# Patient Record
Sex: Female | Born: 1995 | Race: Black or African American | Hispanic: No | Marital: Single | Smoking: Never smoker
Health system: Southern US, Community
[De-identification: ages and names within clinical notes are randomized; demographics above are authoritative.]

## PROBLEM LIST (undated history)

## (undated) ENCOUNTER — Emergency Department (HOSPITAL_COMMUNITY): Admission: EM | Payer: Self-pay | Source: Home / Self Care

## (undated) DIAGNOSIS — K589 Irritable bowel syndrome without diarrhea: Secondary | ICD-10-CM

## (undated) DIAGNOSIS — D649 Anemia, unspecified: Secondary | ICD-10-CM

## (undated) DIAGNOSIS — J45909 Unspecified asthma, uncomplicated: Secondary | ICD-10-CM

## (undated) HISTORY — PX: HERNIA REPAIR: SHX51

---
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
## 2017-02-11 ENCOUNTER — Encounter (HOSPITAL_COMMUNITY): Payer: Self-pay | Admitting: Emergency Medicine

## 2017-02-11 ENCOUNTER — Emergency Department (HOSPITAL_COMMUNITY): Payer: 59

## 2017-02-11 DIAGNOSIS — R05 Cough: Secondary | ICD-10-CM | POA: Diagnosis present

## 2017-02-11 DIAGNOSIS — J4541 Moderate persistent asthma with (acute) exacerbation: Secondary | ICD-10-CM | POA: Diagnosis not present

## 2017-02-11 DIAGNOSIS — J019 Acute sinusitis, unspecified: Secondary | ICD-10-CM | POA: Diagnosis not present

## 2017-02-11 MED ORDER — ALBUTEROL SULFATE (2.5 MG/3ML) 0.083% IN NEBU
INHALATION_SOLUTION | RESPIRATORY_TRACT | Status: AC
  Filled 2017-02-11: qty 6

## 2017-02-11 MED ORDER — ALBUTEROL SULFATE (2.5 MG/3ML) 0.083% IN NEBU
5.0000 mg | INHALATION_SOLUTION | Freq: Once | RESPIRATORY_TRACT | Status: AC
  Administered 2017-02-11: 5 mg via RESPIRATORY_TRACT

## 2017-02-11 NOTE — ED Triage Notes (Signed)
Pt presents with non-productive cough and SOB that began after she was at camp and made to run in the rain; pt states her asthma is activity and weather induced; pt reports taking her inhaler without relief; now has a sore throat from coughing

## 2017-02-12 ENCOUNTER — Emergency Department (HOSPITAL_COMMUNITY)
Admission: EM | Admit: 2017-02-12 | Discharge: 2017-02-12 | Disposition: A | Discharge status: Home / Self Care | Payer: 59 | Attending: Emergency Medicine | Admitting: Emergency Medicine

## 2017-02-12 DIAGNOSIS — J4541 Moderate persistent asthma with (acute) exacerbation: Secondary | ICD-10-CM

## 2017-02-12 DIAGNOSIS — J019 Acute sinusitis, unspecified: Secondary | ICD-10-CM

## 2017-02-12 MED ORDER — ALBUTEROL SULFATE (2.5 MG/3ML) 0.083% IN NEBU
5.0000 mg | INHALATION_SOLUTION | Freq: Once | RESPIRATORY_TRACT | Status: AC
  Administered 2017-02-12: 5 mg via RESPIRATORY_TRACT
  Filled 2017-02-12: qty 6

## 2017-02-12 MED ORDER — IPRATROPIUM BROMIDE 0.02 % IN SOLN
0.5000 mg | Freq: Once | RESPIRATORY_TRACT | Status: AC
  Administered 2017-02-12: 0.5 mg via RESPIRATORY_TRACT
  Filled 2017-02-12: qty 2.5

## 2017-02-12 MED ORDER — PHENOL 1.4 % MT LIQD: 1.0000 | OROMUCOSAL | 0 refills | Status: DC | PRN

## 2017-02-12 MED ORDER — HYDROCODONE-ACETAMINOPHEN 7.5-325 MG/15ML PO SOLN
10.0000 mL | Freq: Once | ORAL | Status: AC
  Administered 2017-02-12: 10 mL via ORAL
  Filled 2017-02-12: qty 15

## 2017-02-12 MED ORDER — BENZONATATE 100 MG PO CAPS: 100.0000 mg | ORAL_CAPSULE | Freq: Three times a day (TID) | ORAL | 0 refills | Status: DC

## 2017-02-12 MED ORDER — AMOXICILLIN-POT CLAVULANATE 875-125 MG PO TABS: 1.0000 | ORAL_TABLET | Freq: Two times a day (BID) | ORAL | 0 refills | Status: DC

## 2017-02-12 MED ORDER — DEXAMETHASONE 4 MG PO TABS
10.0000 mg | ORAL_TABLET | Freq: Once | ORAL | Status: AC
  Administered 2017-02-12: 10 mg via ORAL
  Filled 2017-02-12: qty 3

## 2017-02-12 NOTE — ED Notes (Signed)
Patient given discharge instructions and verbalized understanding.  Patient stable to discharge at this time.  Patient is alert and oriented to baseline.  No distressed noted at this time.  All belongings taken with the patient at discharge.   

## 2017-02-12 NOTE — ED Provider Notes (Signed)
[facility]-EMERGENCY DEPT Provider Note   CSN: [id number] Arrival date & time: 02/11/17  2146     History   Chief Complaint Chief Complaint  Patient presents with  . Asthma  . Cough    HPI Natasha Krause is a 21 y.o. female.  21 year old female with a history of asthma, anemia, and IBS presents to the emergency department for cough and shortness of breath. She states that symptoms began 4 days ago while at band camp when she needed to run in the rain. She states that her asthma is induced by activity and whether. She has tried her albuterol inhaler without relief. She also used a nebulizer prior to arrival with little improvement in her symptoms. Cough is worse with deep breathing and productive of yellow mucus. She will use the mucus is from postnasal drip as she has associated nasal congestion. No associated fevers, nausea, vomiting, syncope. No hemoptysis. Patient states that she used to be on Symbicort, but discontinued this a few months ago as she was having. Her problems with her asthma.      Past Medical History:  Diagnosis Date  . Anemia   . Asthma   . IBS (irritable bowel syndrome)     There are no active problems to display for this patient.   Past Surgical History:  Procedure Laterality Date  . HERNIA REPAIR      OB History    No data available       Home Medications    Prior to Admission medications   Medication Sig Start Date End Date Taking? Authorizing Provider  albuterol (PROVENTIL) (2.5 MG/3ML) 0.083% nebulizer solution Take 2.5 mg by nebulization every 6 (six) hours as needed for wheezing or shortness of breath.    [provider]  amoxicillin-clavulanate (AUGMENTIN) 875-125 MG tablet Take 1 tablet by mouth every 12 (twelve) hours. 02/12/17   Antony Madura, PA-C  benzonatate (TESSALON) 100 MG capsule Take 1 capsule (100 mg total) by mouth every 8 (eight) hours. 02/12/17   Antony Madura, PA-C  budesonide-formoterol (SYMBICORT) 160-4.5 MCG/ACT inhaler  Inhale 2 puffs into the lungs 2 (two) times daily.    [provider]  phenol (CHLORASEPTIC) 1.4 % LIQD Use as directed 1 spray in the mouth or throat as needed for throat irritation / pain. 02/12/17   Antony Madura, PA-C    Family History History reviewed. No pertinent family history.  Social History Social History  Substance Use Topics  . Smoking status: Never Smoker  . Smokeless tobacco: Never Used  . Alcohol use No     Allergies   Fish allergy and Nsaids   Review of Systems Review of Systems Ten systems reviewed and are negative for acute change, except as noted in the HPI.    Physical Exam Updated Vital Signs BP 114/64   Pulse (!) 107   Temp 98.8 F (37.1 C) (Oral)   Resp (!) 22   LMP 12/09/2016   SpO2 97%   Physical Exam  Constitutional: She is oriented to person, place, and time. She appears well-developed and well-nourished. No distress.  Nontoxic appearing and in NAD  HENT:  Head: Normocephalic and atraumatic.  Audible nasal congestion. Patient tolerating secretions without difficulty.  Eyes: Conjunctivae and EOM are normal. No scleral icterus.  Neck: Normal range of motion.  Cardiovascular: Normal rate, regular rhythm and intact distal pulses.   Pulmonary/Chest: Effort normal. No respiratory distress. She has no rales.  Dry cough productive of yellow phlegm. Chest expansion symmetric. Faint expiratory  wheeze; middle lobes. No rales or rhonchi.  Musculoskeletal: Normal range of motion.  Neurological: She is alert and oriented to person, place, and time. She exhibits normal muscle tone. Coordination normal.  GCS 15. Patient moving all extremities; ambulatory with steady gait.  Skin: Skin is warm and dry. No rash noted. She is not diaphoretic. No erythema. No pallor.  Psychiatric: She has a normal mood and affect. Her behavior is normal.  Nursing note and vitals reviewed.    ED Treatments / Results  Labs (all labs ordered are listed, but only  abnormal results are displayed) Labs Reviewed - No data to display  EKG  EKG Interpretation None       Radiology Dg Chest 2 View  Result Date: 02/11/2017 CLINICAL DATA:  Acute onset of shortness of breath and cough. Congestion. Blood in mucus. Initial encounter. EXAM: CHEST  2 VIEW COMPARISON:  None. FINDINGS: The lungs are well-aerated and clear. There is no evidence of focal opacification, pleural effusion or pneumothorax. The heart is normal in size; the mediastinal contour is within normal limits. No acute osseous abnormalities are seen. IMPRESSION: No acute cardiopulmonary process seen. Electronically Signed   By: Roanna RaiderJeffery  Chang M.D.   On: 02/11/2017 23:20    Procedures Procedures (including critical care time)  Medications Ordered in ED Medications  albuterol (PROVENTIL) (2.5 MG/3ML) 0.083% nebulizer solution (  Not Given 02/11/17 2342)  albuterol (PROVENTIL) (2.5 MG/3ML) 0.083% nebulizer solution 5 mg (5 mg Nebulization Given 02/11/17 2209)  dexamethasone (DECADRON) tablet 10 mg (10 mg Oral Given 02/12/17 0215)  albuterol (PROVENTIL) (2.5 MG/3ML) 0.083% nebulizer solution 5 mg (5 mg Nebulization Given 02/12/17 0121)  ipratropium (ATROVENT) nebulizer solution 0.5 mg (0.5 mg Nebulization Given 02/12/17 0120)  HYDROcodone-acetaminophen (HYCET) 7.5-325 mg/15 ml solution 10 mL (10 mLs Oral Given 02/12/17 0214)    2:10 AM Patient reassessed. She reports improvement in her breathing following DuoNeb. Will check pulse ox with ambulation. If no desaturation, anticipate discharge with symptomatic tx and Augmentin.   Initial Impression / Assessment and Plan / ED Course  I have reviewed the triage vital signs and the nursing notes.  Pertinent labs & imaging results that were available during my care of the patient were reviewed by me and considered in my medical decision making (see chart for details).     Patient complaining of symptoms of sinusitis. Symptoms include nasal discharge/PND and  scratchy throat with cough for less than 10 days. Symptoms have been aggravating the patient's asthma. She is afebrile. Xray negative for PNA. Breathing has improved with DuoNeb and steroids. Patient discharged with symptomatic treatment and course of Augmentin. Recommendations for follow-up with primary care physician provided. Patient discharged in stable condition with no unaddressed concerns.   Final Clinical Impressions(s) / ED Diagnoses   Final diagnoses:  Moderate persistent asthma with exacerbation  Acute sinusitis, recurrence not specified, unspecified location    New Prescriptions Discharge Medication List as of 02/12/2017  2:44 AM    START taking these medications   Details  amoxicillin-clavulanate (AUGMENTIN) 875-125 MG tablet Take 1 tablet by mouth every 12 (twelve) hours., Starting Sun 02/12/2017, Print    benzonatate (TESSALON) 100 MG capsule Take 1 capsule (100 mg total) by mouth every 8 (eight) hours., Starting Sun 02/12/2017, Print    phenol (CHLORASEPTIC) 1.4 % LIQD Use as directed 1 spray in the mouth or throat as needed for throat irritation / pain., Starting Sun 02/12/2017, Print         Antony MaduraHumes, Sharmaine Bain,  PA-C 02/12/17 0540    Gilda CreasePollina, Christopher J, MD 02/12/17 [phone]

## 2017-02-12 NOTE — ED Notes (Signed)
While ambulating pt O2 was 100% with a pulse of 120.

## 2017-10-24 ENCOUNTER — Telehealth: Payer: Self-pay | Admitting: Neurology

## 2017-10-24 ENCOUNTER — Ambulatory Visit: Payer: 59 | Admitting: Neurology

## 2017-10-24 NOTE — Telephone Encounter (Signed)
This patient did not show for a new patient appointment today. 

## 2017-12-25 ENCOUNTER — Other Ambulatory Visit: Payer: Self-pay | Admitting: Family

## 2018-03-31 ENCOUNTER — Other Ambulatory Visit: Payer: Self-pay

## 2018-03-31 ENCOUNTER — Emergency Department (HOSPITAL_COMMUNITY)
Admission: EM | Admit: 2018-03-31 | Discharge: 2018-03-31 | Disposition: A | Discharge status: Home / Self Care | Payer: 59 | Attending: Emergency Medicine | Admitting: Emergency Medicine

## 2018-03-31 ENCOUNTER — Emergency Department (HOSPITAL_COMMUNITY): Payer: 59

## 2018-03-31 ENCOUNTER — Encounter (HOSPITAL_COMMUNITY): Payer: Self-pay | Admitting: Emergency Medicine

## 2018-03-31 DIAGNOSIS — J45909 Unspecified asthma, uncomplicated: Secondary | ICD-10-CM | POA: Diagnosis not present

## 2018-03-31 DIAGNOSIS — R0789 Other chest pain: Secondary | ICD-10-CM | POA: Insufficient documentation

## 2018-03-31 DIAGNOSIS — R0602 Shortness of breath: Secondary | ICD-10-CM | POA: Diagnosis not present

## 2018-03-31 DIAGNOSIS — R002 Palpitations: Secondary | ICD-10-CM | POA: Insufficient documentation

## 2018-03-31 LAB — BASIC METABOLIC PANEL
Anion gap: 8 (ref 5–15)
BUN: 13 mg/dL (ref 6–20)
CO2: 26 mmol/L (ref 22–32)
CREATININE: 0.99 mg/dL (ref 0.44–1.00)
Calcium: 9.4 mg/dL (ref 8.9–10.3)
Chloride: 105 mmol/L (ref 98–111)
GFR calc Af Amer: >60 mL/min (ref >60)
Glucose, Bld: 72 mg/dL (ref 70–99)
POTASSIUM: 3.5 mmol/L (ref 3.5–5.1)
SODIUM: 139 mmol/L (ref 135–145)

## 2018-03-31 LAB — CBC
HCT: 42.8 % (ref 36.0–46.0)
HEMOGLOBIN: 14.1 g/dL (ref 12.0–15.0)
MCH: 30.9 pg (ref 26.0–34.0)
MCHC: 32.9 g/dL (ref 30.0–36.0)
MCV: 93.7 fL (ref 78.0–100.0)
Platelets: 251 10*3/uL (ref 150–400)
RBC: 4.57 MIL/uL (ref 3.87–5.11)
RDW: 12.2 % (ref 11.5–15.5)
WBC: 5.4 10*3/uL (ref 4.0–10.5)

## 2018-03-31 LAB — I-STAT TROPONIN, ED: TROPONIN I, POC: 0 ng/mL (ref 0.00–0.08)

## 2018-03-31 LAB — D-DIMER, QUANTITATIVE (NOT AT ARMC)

## 2018-03-31 LAB — I-STAT BETA HCG BLOOD, ED (MC, WL, AP ONLY)

## 2018-03-31 LAB — TSH: TSH: 2.96 u[IU]/mL (ref 0.350–4.500)

## 2018-03-31 NOTE — Discharge Instructions (Addendum)
Your thyroid hormone and your d-dimer were negative.  You do not have a blood clot and it does not appear to have any issues with her thyroid.  New a list of various contraceptive measures.  Please if you are not going to use your NuvaRing follow-up quickly so you avoid any unwanted pregnancies.  He will need to follow-up with a primary care physician in the next 7 to 14 days regarding your continued palpitations.  Contact a health care provider if: You continue to have a fast or irregular heartbeat after 24 hours. Your palpitations occur more often. Get help right away if: You have chest pain or shortness of breath. You have a severe headache. You feel dizzy or you faint.

## 2018-03-31 NOTE — ED Provider Notes (Signed)
[facility] EM[facility]ENC[facility] Provider Note   CSN: [id number] Arrival date & time: 03/31/18  1833     History   Chief Complaint Chief Complaint  Patient presents with  . Palpitations    HPI Natasha Krause is a 22 y.o. female.  He presents the emergency department chief complaint of palpitations.  Patient states that her family has a history of thyroid disorder.  She has had about 3 weeks of intermittent chest pain, racing heart and associated shortness of breath.  Patient states that she is on the NuvaRing and looked up online that it could cause pulmonary embolus and that people were dying from it so she took her NuvaRing out today.  She denies hemoptysis, unilateral leg swelling.  Patient does not smoke cigarettes.  She denies fever, chills.  She denies any recent injuries or confinement.  HPI  Past Medical History:  Diagnosis Date  . Anemia   . Asthma   . IBS (irritable bowel syndrome)     There are no active problems to display for this patient.   Past Surgical History:  Procedure Laterality Date  . HERNIA REPAIR       OB History   None      Home Medications    Prior to Admission medications   Medication Sig Start Date End Date Taking? Authorizing Provider  albuterol (PROVENTIL) (2.5 MG/3ML) 0.083% nebulizer solution Take 2.5 mg by nebulization every 6 (six) hours as needed for wheezing or shortness of breath.    [provider]  amoxicillin-clavulanate (AUGMENTIN) 875-125 MG tablet Take 1 tablet by mouth every 12 (twelve) hours. 02/12/17   Antony MaduraHumes, Kelly, PA-C  benzonatate (TESSALON) 100 MG capsule Take 1 capsule (100 mg total) by mouth every 8 (eight) hours. 02/12/17   Antony MaduraHumes, Kelly, PA-C  budesonide-formoterol (SYMBICORT) 160-4.5 MCG/ACT inhaler Inhale 2 puffs into the lungs 2 (two) times daily.    [provider]  phenol (CHLORASEPTIC) 1.4 % LIQD Use as directed 1 spray in the mouth or throat as needed for throat irritation /  pain. 02/12/17   Antony MaduraHumes, Kelly, PA-C    Family History No family history on file.  Social History Social History   Tobacco Use  . Smoking status: Never Smoker  . Smokeless tobacco: Never Used  Substance Use Topics  . Alcohol use: No  . Drug use: No     Allergies   Fish allergy and Nsaids   Review of Systems Review of Systems  Ten systems reviewed and are negative for acute change, except as noted in the HPI.   Physical Exam Updated Vital Signs BP 109/61   Pulse 66   Temp 99.3 F (37.4 C)   Resp 14   LMP 03/19/2018   SpO2 100%   Physical Exam  Constitutional: She is oriented to person, place, and time. She appears well-developed and well-nourished. No distress.  HENT:  Head: Normocephalic and atraumatic.  Eyes: Conjunctivae are normal. No scleral icterus.  Neck: Normal range of motion.  Cardiovascular: Normal rate, regular rhythm and normal heart sounds. Exam reveals no gallop and no friction rub.  No murmur heard. Pulmonary/Chest: Effort normal and breath sounds normal. No stridor. No respiratory distress. She has no wheezes.  Abdominal: Soft. Bowel sounds are normal. She exhibits no distension and no mass. There is no tenderness. There is no guarding.  Neurological: She is alert and oriented to person, place, and time.  Skin: Skin is warm and dry. She is not diaphoretic.  Psychiatric:  Her behavior is normal.  Nursing note and vitals reviewed.    ED Treatments / Results  Labs (all labs ordered are listed, but only abnormal results are displayed) Labs Reviewed  BASIC METABOLIC PANEL  CBC  D-DIMER, QUANTITATIVE (NOT AT [facility])  TSH  I-STAT TROPONIN, ED  I-STAT BETA HCG BLOOD, ED ([facility], [facility], [facility] ONLY)    EKG None ECG interpretation   Date: 03/31/2018  Rate: 68  Rhythm: normal sinus rhythm  QRS Axis: normal  Intervals: normal  ST/T Wave abnormalities: normal  Conduction Disutrbances: none  Narrative Interpretation:   Old EKG Reviewed:  none  Radiology Dg Chest 2 View  Result Date: 03/31/2018 CLINICAL DATA:  Chest pain and heart palpitations EXAM: CHEST - 2 VIEW COMPARISON:  02/11/2017 FINDINGS: The heart size and mediastinal contours are within normal limits. Both lungs are clear. The visualized skeletal structures are unremarkable. IMPRESSION: No active cardiopulmonary disease. Electronically Signed   By: Tollie Eth M.D.   On: 03/31/2018 19:32    Procedures Procedures (including critical care time)  Medications Ordered in ED Medications - No data to display   Initial Impression / Assessment and Plan / ED Course  I have reviewed the triage vital signs and the nursing notes.  Pertinent labs & imaging results that were available during my care of the patient were reviewed by me and considered in my medical decision making (see chart for details).   22 year old female who presents the emergency department with complaint of palpitations. The differential diagnosis for palpitations includes cardiac arrhythmias, PVC/PAC, ACS, Cardiomyopathy, CHF, MVP, pericarditis, valvular disease, Panic/Anxiety, Somatic disorder, ETOH, Caffeine,  Stimulant use, medication side effect, Anemia, Hyperthyroidism, pulmonary embolism.  She has a negative pregnancy test.  Her labs are otherwise reassuring.  Negative d-dimer and TSH is within normal limits.  Patient's chest x-ray is negative.  I reviewed the PA and lateral chest films which showed no significant abnormalities and agree with the radiologist logic interpretation.  Patient's EKG shows normal sinus rhythm.  No signs of ischemia or arrhythmia.  I discussed with the patient the need for continued birth control to prevent unwanted pregnancy.  She agrees to follow closely and use other forms of protection during this time.  Patient appears appropriate for discharge follow-up with outpatient provider.  Discussed return precautions.      Final Clinical Impressions(s) / ED Diagnoses   Final  diagnoses:  Palpitations    ED Discharge Orders    None       Arthor Captain, PA-C 03/31/18 2316    Arby Barrette, MD 03/31/18 2316

## 2018-03-31 NOTE — ED Triage Notes (Addendum)
Pt states she had a new nuva ring placed 1 week ago and has been having fluttering to her chest with pain. Pt also endorses shortness of breath.

## 2018-04-23 ENCOUNTER — Other Ambulatory Visit (INDEPENDENT_AMBULATORY_CARE_PROVIDER_SITE_OTHER): Payer: Self-pay | Admitting: Family

## 2018-07-02 ENCOUNTER — Other Ambulatory Visit (INDEPENDENT_AMBULATORY_CARE_PROVIDER_SITE_OTHER): Payer: Self-pay | Admitting: Medical

## 2018-07-10 ENCOUNTER — Other Ambulatory Visit (INDEPENDENT_AMBULATORY_CARE_PROVIDER_SITE_OTHER): Payer: Self-pay | Admitting: Family Medicine

## 2018-12-17 ENCOUNTER — Ambulatory Visit: Payer: Self-pay

## 2018-12-17 ENCOUNTER — Other Ambulatory Visit: Payer: Self-pay

## 2018-12-17 ENCOUNTER — Other Ambulatory Visit: Payer: Self-pay | Admitting: Occupational Medicine

## 2018-12-17 DIAGNOSIS — M79671 Pain in right foot: Secondary | ICD-10-CM

## 2019-01-30 ENCOUNTER — Other Ambulatory Visit: Payer: Self-pay

## 2019-01-30 DIAGNOSIS — Z20822 Contact with and (suspected) exposure to covid-19: Secondary | ICD-10-CM

## 2019-02-03 LAB — NOVEL CORONAVIRUS, NAA: SARS-CoV-2, NAA: NOT DETECTED

## 2019-02-05 LAB — CBC AND DIFFERENTIAL
Baso(Absolute): 0 10*3/uL (ref 0.0–0.2)
Basos: 1 %
Eos: 5 %
Eosinophils Absolute: 0.2 10*3/uL (ref 0.0–0.4)
Hematocrit: 40.5 % (ref 34.0–46.6)
Hemoglobin: 14 g/dL (ref 11.1–15.9)
Immature Granulocytes Absolute: 0 10*3/uL (ref 0.0–0.1)
Immature Granulocytes: 0 %
Lymphocytes Absolute: 2.2 10*3/uL (ref 0.7–3.1)
Lymphocytes: 42 %
MCH: 31.3 pg (ref 26.6–33.0)
MCHC: 34.6 g/dL (ref 31.5–35.7)
MCV: 91 fL (ref 79–97)
Monocytes Absolute: 0.5 10*3/uL (ref 0.1–0.9)
Monocytes: 9 %
Neutrophils Absolute: 2.2 10*3/uL (ref 1.4–7.0)
Neutrophils: 43 %
Platelets: 312 10*3/uL (ref 150–450)
RBC: 4.47 x10E6/uL (ref 3.77–5.28)
RDW: 12.2 % — ABNORMAL LOW (ref 12.3–15.4)
WBC: 5.1 10*3/uL (ref 3.4–10.8)

## 2019-02-05 LAB — EBV ACUTE INFECTION ANTIBODIES
EBV VCA Ab, IgG: 247 U/mL — ABNORMAL HIGH (ref 0.0–17.9)
EBV VCA Ab, IgM: <36 U/mL (ref 0.0–35.9)
Epstein-Barr Virus Early Antigen, IgG: <9 U/mL (ref 0.0–8.9)
Epstein-Barr virus, Nuclear AG AB: 286 U/mL — ABNORMAL HIGH (ref 0.0–17.9)

## 2019-02-05 LAB — THYROID SCREEN
T4, Free: 1.46 ng/dL (ref 0.82–1.77)
TSH: 2.68 u[IU]/mL (ref 0.450–4.500)

## 2019-02-05 LAB — FERRITIN: Ferritin: 38 ng/mL (ref 15–150)

## 2019-02-13 LAB — UPPER RESPIRATORY CULTURE

## 2019-02-13 LAB — HEPATITIS C ANTIBODY: HCV AB: <0.1 s/co ratio (ref 0.0–0.9)

## 2019-02-13 LAB — RPR: RPR: NONREACTIVE

## 2019-02-13 LAB — CHLAMYDIA GONORRHOEAE NAA
CHLAMYDIA TRACHOMATIS, NAA: NEGATIVE
Neisseria gonorrhoeae, NAA: NEGATIVE

## 2019-02-13 LAB — HEPATITIS B SURFACE ANTIGEN W/ REFLEX TO CONFIRMATION: Hepatitis B Surface Antigen: NEGATIVE

## 2019-02-13 LAB — TREPONEMA PALLIDUM SCREENING CASCADE: T pallidum Antibodies: NEGATIVE

## 2019-02-13 LAB — HIV-1/2 AG/AB 4TH GEN. W/ REFLEX: HIV Screen 4th Generation wRfx: NONREACTIVE

## 2019-04-19 ENCOUNTER — Other Ambulatory Visit: Payer: Self-pay | Admitting: Emergency Medicine

## 2019-04-19 DIAGNOSIS — Z20822 Contact with and (suspected) exposure to covid-19: Secondary | ICD-10-CM

## 2019-04-20 LAB — NOVEL CORONAVIRUS, NAA: SARS-CoV-2, NAA: NOT DETECTED

## 2019-05-29 ENCOUNTER — Other Ambulatory Visit: Payer: Self-pay

## 2019-05-29 DIAGNOSIS — Z20822 Contact with and (suspected) exposure to covid-19: Secondary | ICD-10-CM

## 2019-05-30 LAB — NOVEL CORONAVIRUS, NAA: SARS-CoV-2, NAA: NOT DETECTED

## 2019-07-15 ENCOUNTER — Ambulatory Visit: Payer: 59 | Attending: Internal Medicine

## 2019-07-15 DIAGNOSIS — Z20822 Contact with and (suspected) exposure to covid-19: Secondary | ICD-10-CM

## 2019-07-16 LAB — NOVEL CORONAVIRUS, NAA: SARS-CoV-2, NAA: NOT DETECTED

## 2019-07-22 ENCOUNTER — Ambulatory Visit: Payer: 59 | Attending: Internal Medicine

## 2019-07-22 DIAGNOSIS — Z20822 Contact with and (suspected) exposure to covid-19: Secondary | ICD-10-CM

## 2019-07-23 ENCOUNTER — Encounter (INDEPENDENT_AMBULATORY_CARE_PROVIDER_SITE_OTHER): Payer: Self-pay | Admitting: Family Medicine

## 2019-07-23 LAB — NOVEL CORONAVIRUS, NAA: SARS-CoV-2, NAA: NOT DETECTED

## 2019-07-25 ENCOUNTER — Telehealth (INDEPENDENT_AMBULATORY_CARE_PROVIDER_SITE_OTHER): Payer: No Typology Code available for payment source | Admitting: Family Medicine

## 2019-07-25 ENCOUNTER — Encounter (INDEPENDENT_AMBULATORY_CARE_PROVIDER_SITE_OTHER): Payer: Self-pay | Admitting: Family Medicine

## 2019-07-25 DIAGNOSIS — R062 Wheezing: Secondary | ICD-10-CM

## 2019-07-25 DIAGNOSIS — N644 Mastodynia: Secondary | ICD-10-CM

## 2019-07-25 DIAGNOSIS — Z3041 Encounter for surveillance of contraceptive pills: Secondary | ICD-10-CM

## 2019-07-25 MED ORDER — TRI-SPRINTEC 0.18/0.215/0.25 MG-35 MCG PO TABS
1.0000 | ORAL_TABLET | Freq: Every day | ORAL | 2 refills | Status: DC
Start: 2019-07-25 — End: 2019-12-03

## 2019-07-25 MED ORDER — BUDESONIDE-FORMOTEROL FUMARATE 160-4.5 MCG/ACT IN AERO
2.0000 | INHALATION_SPRAY | Freq: Two times a day (BID) | RESPIRATORY_TRACT | 3 refills | Status: DC
Start: 2019-07-25 — End: 2019-12-03

## 2019-07-25 MED ORDER — PROAIR HFA 108 (90 BASE) MCG/ACT IN AERS
2.0000 | INHALATION_SPRAY | RESPIRATORY_TRACT | 3 refills | Status: DC | PRN
Start: 2019-07-25 — End: 2021-02-09

## 2019-07-25 NOTE — Progress Notes (Signed)
[facility] FAMILY PRACTICE - AN [place] PARTNER                       Date of Virtual Visit: 07/25/2019 9:50 AM        Patient ID: Phyllis Brown is a 24 y.o. female.  Attending Physician: Myriam Jacobson, MD       Telemedicine Eligibility:    State Location:  [x]  [facility]  []  [place]  []  [place] []  [place]  []  Other:    Personal identity shared with patient:  [x]  Yes  []  No    Education on nature of video visit shared with patient, and patient agrees that this will be billed to the patient's insurance:  [x]  Yes  []  No         Chief Complaint:    Chief Complaint   Patient presents with    Breast Pain               HPI:    2 years of left breast pain.  Comes and goes.  U/s was normal.  She was told it could be caffeine intake or bra or her birth control pill.  She notes when she is off the OCP the pain goes away.  However she is concerned this is lingering for so long.  She does not want to be off of OCPs.   Denies swelling, lumps, or nipple discharge.     Also needs refill of her pill  Asthma is stable, but needs refills            Problem List:    There is no problem list on file for this patient.            Current Meds:    Outpatient Medications Marked as Taking for the 07/25/19 encounter (Telemedicine Visit) with Myriam Jacobson, MD   Medication Sig Dispense Refill    albuterol (PROVENTIL) (2.5 MG/3ML) 0.083% nebulizer solution Take 2.5 mg by nebulization every 6 (six) hours as needed.      budesonide-formoterol (Symbicort) 160-4.5 MCG/ACT inhaler Inhale 2 puffs into the lungs 2 (two) times daily 1 Inhaler 3    ProAir HFA 108 (90 Base) MCG/ACT inhaler Inhale 2 puffs into the lungs every 4 (four) hours as needed for Wheezing 1 Inhaler 3    Tri-Sprintec 0.18/0.215/0.25 MG-35 MCG Tab Take 1 tablet by mouth daily 90 tablet 2    [DISCONTINUED] budesonide-formoterol (Symbicort) 160-4.5 MCG/ACT inhaler Symbicort 160 mcg-4.5 mcg/actuation HFA aerosol inhaler   INHALE 2  PUFFS TWICE A DAY      [DISCONTINUED] PROAIR HFA 108 (90 BASE) MCG/ACT inhaler       [DISCONTINUED] Tri-Sprintec 0.18/0.215/0.25 MG-35 MCG Tab TAKE 1 TABLET BY MOUTH EVERY DAY. FOLLOW UP BEFORE NEXT REFILL            Allergies:    Allergies   Allergen Reactions    Fish-Derived Products Anaphylaxis    Motrin [Ibuprofen]     Nsaids Angioedema             Past Surgical History:    Past Surgical History:   Procedure Laterality Date    HERNIA REPAIR  07/11/2001    Rpr umbil hern reduc < 5 yr           Family History:    Family History   Problem Relation Age of Onset    No known problems Mother     No known problems Father  Leukemia Maternal Grandfather     Colon cancer Paternal Grandmother     Hypertension Paternal Grandfather            Social History:    Social History     Tobacco Use    Smoking status: Never Smoker    Smokeless tobacco: Never Used   Substance Use Topics    Alcohol use: No    Drug use: No          The following sections were reviewed this encounter by the provider:   Tobacco   Allergies   Meds   Problems   Med Hx   Surg Hx   Fam Hx              Vital Signs:    There were no vitals taken for this visit.         ROS:    ROS  No High Fever  No Chest Pain  No Generalized Rash          Physical Exam:    Physical Exam   GENERAL APPEARANCE: alert, in no acute distress, pleasant, well nourished.   HEAD: normal appearance  EYES: no discharge  EARS: normal hearing  NECK/THYROID: appearance -supple  PSYCH: appropriate affect, appropriate mood, normal speech, normal attention        Assessment:    1. Wheezing  - budesonide-formoterol (Symbicort) 160-4.5 MCG/ACT inhaler; Inhale 2 puffs into the lungs 2 (two) times daily  Dispense: 1 Inhaler; Refill: 3  - ProAir HFA 108 (90 Base) MCG/ACT inhaler; Inhale 2 puffs into the lungs every 4 (four) hours as needed for Wheezing  Dispense: 1 Inhaler; Refill: 3    2. Encounter for surveillance of contraceptive pills  - Tri-Sprintec 0.18/0.215/0.25 MG-35 MCG  Tab; Take 1 tablet by mouth daily  Dispense: 90 tablet; Refill: 2    3. Mastalgia  - Mammo Digital Diagnostic Left W Cad            Plan:    We discussed these concerns today, and pt was instructed to follow up if anything changes, new symptom development, or additional concerns.  Providers are available 24 hours a day for urgent issues, and in-person visits can be facilitated at anytime when appropriate.           Follow-up:    No follow-ups on file.         Myriam Jacobson, MD

## 2019-09-26 ENCOUNTER — Ambulatory Visit: Payer: 59 | Attending: Family

## 2019-09-26 DIAGNOSIS — Z23 Encounter for immunization: Secondary | ICD-10-CM

## 2019-09-26 NOTE — Progress Notes (Signed)
   Covid-19 Vaccination Clinic  Name:  Natasha Krause    MRN: [id number] DOB: Jul 01, 1996  09/26/2019  [name] was observed post Covid-19 immunization for 15 minutes without incident. She was provided with Vaccine Information Sheet and instruction to access the V-Safe system.   [name] was instructed to call 911 with any severe reactions post vaccine: Marland Kitchen Difficulty breathing  . Swelling of face and throat  . A fast heartbeat  . A bad rash all over body  . Dizziness and weakness   Immunizations Administered    Name Date Dose VIS Date Route   Moderna COVID-19 Vaccine 09/26/2019 11:58 AM 0.5 mL 06/11/2019 Intramuscular   Manufacturer: Moderna   Lot: [id number]   NDC: 53664-403-47

## 2019-10-29 ENCOUNTER — Ambulatory Visit: Payer: 59 | Attending: Family

## 2019-10-29 DIAGNOSIS — Z23 Encounter for immunization: Secondary | ICD-10-CM

## 2019-10-29 NOTE — Progress Notes (Signed)
   Covid-19 Vaccination Clinic  Name:  Natasha Krause    MRN: [id number] DOB: 06/30/1996  10/29/2019  [name] was observed post Covid-19 immunization for 15 minutes without incident. She was provided with Vaccine Information Sheet and instruction to access the V-Safe system.   [name] was instructed to call 911 with any severe reactions post vaccine: Marland Kitchen Difficulty breathing  . Swelling of face and throat  . A fast heartbeat  . A bad rash all over body  . Dizziness and weakness   Immunizations Administered    Name Date Dose VIS Date Route   Moderna COVID-19 Vaccine 10/29/2019 12:01 PM 0.5 mL 06/2019 Intramuscular   Manufacturer: Moderna   Lot: [id number]   NDC: 26834-196-22

## 2019-11-23 DIAGNOSIS — F419 Anxiety disorder, unspecified: Secondary | ICD-10-CM | POA: Insufficient documentation

## 2019-12-03 ENCOUNTER — Ambulatory Visit (INDEPENDENT_AMBULATORY_CARE_PROVIDER_SITE_OTHER): Payer: No Typology Code available for payment source | Admitting: Family Medicine

## 2019-12-03 VITALS — BP 110/60 | Temp 98.5°F | Wt 128.0 lb

## 2019-12-03 DIAGNOSIS — F32A Depression, unspecified: Secondary | ICD-10-CM

## 2019-12-03 DIAGNOSIS — F320 Major depressive disorder, single episode, mild: Secondary | ICD-10-CM

## 2019-12-03 DIAGNOSIS — Z0000 Encounter for general adult medical examination without abnormal findings: Secondary | ICD-10-CM

## 2019-12-03 DIAGNOSIS — J320 Chronic maxillary sinusitis: Secondary | ICD-10-CM

## 2019-12-03 DIAGNOSIS — Z124 Encounter for screening for malignant neoplasm of cervix: Secondary | ICD-10-CM

## 2019-12-03 DIAGNOSIS — Z3041 Encounter for surveillance of contraceptive pills: Secondary | ICD-10-CM

## 2019-12-03 MED ORDER — AMOXICILLIN-POT CLAVULANATE 875-125 MG PO TABS
1.00 | ORAL_TABLET | Freq: Two times a day (BID) | ORAL | 0 refills | Status: AC
Start: 2019-12-03 — End: 2019-12-13

## 2019-12-03 MED ORDER — TRI-SPRINTEC 0.18/0.215/0.25 MG-35 MCG PO TABS
1.0000 | ORAL_TABLET | Freq: Every day | ORAL | 2 refills | Status: DC
Start: 2019-12-03 — End: 2020-10-09

## 2019-12-03 NOTE — Progress Notes (Signed)
[facility] FAMILY PRACTICE - AN [place] PARTNER                       Date of Exam: 12/03/2019 2:33 PM        Patient ID: Phyllis Brown is a 24 y.o. female.  Attending Physician: Myriam Jacobson, MD        Chief Complaint:    Chief Complaint   Patient presents with    Annual Exam               HPI:    She reports depression symptoms.  See [id number]    Voice has been raspy since January after having a cold.  She reports she will easily lose her voice.    Little interest or pleasure in doing things: 3 (12/03/2019 12:00 AM)  Feeling down, depressed, or hopeless: 2 (12/03/2019 12:00 AM)  Trouble falling or staying asleep, or sleeping too much: 0 (12/03/2019 12:00 AM)  Feeling tired or having little energy: 2 (12/03/2019 12:00 AM)  Poor appetite or overeating: 3 (12/03/2019 12:00 AM)  Feeling bad about yourself - or that you are a failure or have let yourself or your family down: 2 (12/03/2019 12:00 AM)  Trouble concentrating on things, such as reading the newspaper or watching television: 0 (12/03/2019 12:00 AM)  Moving or speaking so slowly that other people could have noticed. Or the opposite - being so fidgety or restless that you have been moving around a lot more than usual: 0 (12/03/2019 12:00 AM)  Thoughts that you would be better off dead, or of hurting yourself in some way: 0 (12/03/2019 12:00 AM)  PHQ-9 Total Score:    Interpretion: 1-4 = Minimal depression, 5-9 = Mild depression, 10-14 = Moderate depression, 15-19 = Moderately severe depression, 20-27 = Severe depression  : 12 (12/03/2019 12:00 AM)  If you checked off any problems, how difficult have these problems made it for you to do your work, take care of things at home, or get along with other people?: Somewhat difficult (12/03/2019 12:00 AM)      Visit Type: Health Maintenance Visit  Pt presents today for annual physical.     Diet is healthy, well-balanced with normal portions? normal   Exercising regularly?  daily  Normal sleep patterns?   sleep is good  Overall mood:  so-so, up and down     IMMUNIZATIONS - see form  Tdap - utd  Flu- utd  COVID- utd     PREVENTATIVE SCREENINGS   MOST RECENT:   Dental Exam - utd  Eye Exam - utd  Hearing concerns?  No   Colonoscopy/FIT (over age 12)-  n/a  Pap smear (over age 15)-  today  Mammogram (over age 55)-  none             Problem List:    There is no problem list on file for this patient.            Current Meds:    Outpatient Medications Marked as Taking for the 12/03/19 encounter (Office Visit) with Myriam Jacobson, MD   Medication Sig Dispense Refill    ProAir HFA 108 (90 Base) MCG/ACT inhaler Inhale 2 puffs into the lungs every 4 (four) hours as needed for Wheezing 1 Inhaler 3    Tri-Sprintec 0.18/0.215/0.25 MG-35 MCG Tab Take 1 tablet by mouth daily 90 tablet 2    valacyclovir (VALTREX) 1000 MG tablet       [  DISCONTINUED] Tri-Sprintec 0.18/0.215/0.25 MG-35 MCG Tab Take 1 tablet by mouth daily 90 tablet 2          Allergies:    Allergies   Allergen Reactions    Fish-Derived Products Anaphylaxis    Motrin [Ibuprofen]     Nsaids Angioedema             Past Surgical History:    Past Surgical History:   Procedure Laterality Date    HERNIA REPAIR  07/11/2001    Rpr umbil hern reduc < 5 yr           Family History:    Family History   Problem Relation Age of Onset    No known problems Mother     No known problems Father     Leukemia Maternal Grandfather     Colon cancer Paternal Grandmother     Hypertension Paternal Grandfather            Social History:    Social History     Tobacco Use    Smoking status: Never Smoker    Smokeless tobacco: Never Used   Substance Use Topics    Alcohol use: No    Drug use: No          The following sections were reviewed this encounter by the provider:   Tobacco   Allergies   Meds   Problems   Med Hx   Surg Hx   Fam Hx              Vital Signs:    BP 110/60    Temp 98.5 F (36.9 C) (Temporal)    Wt 58.1 kg (128 lb)    BMI 22.67 kg/m          ROS:    Review of  Systems   General/Constitutional:  Denies Chills. Denies Fever.   Ophthalmologic:   Denies Blurred vision.   ENT:   Denies Ear pain. Denies Nosebleed. Denies Sinus pain. Denies Sore throat.   Endocrine:   Denies Polydipsia. Denies Polyuria. Denies Weakness.   Respiratory:   Denies Shortness of breath. Denies Wheezing.   Cardiovascular:   Denies Chest pain. Denies Chest pain with exertion. Denies Leg Claudication. Denies Palpitations. Denies Swelling in hands/feet.   Gastrointestinal:   Denies Abdominal pain. Denies Blood in stool.  Hematology:   Denies Easy bruising. Denies Easy Bleeding. Denies Swollen glands.   Genitourinary:   Denies Blood in urine. Denies Painful urination.   Musculoskeletal:   Denies Joint pain. Denies Joint stiffness. Denies Swollen joints.   Skin:   Denies Itching. Denies Change in Mole(s). Denies Rash.   Neurologic:   Denies Balance difficulty Denies Gait abnormality. Denies Pre-Syncope. Denies Memory loss. Denies Seizures. Denies Tingling/Numbness.              Physical Exam:    Physical Exam   GENERAL APPEARANCE: alert, in no acute distress, well developed, well nourished, oriented to time, place, and person.   HEAD: normal appearance, atraumatic.   EYES: extraocular movement intact (EOMI), sclera anicteric, conjunctiva clear.   EARS: tympanic membranes normal bilaterally, external canals normal .   NECK/THYROID: neck supple, no carotid bruit, no cervical lymphadenopathy, no neck mass palpated, no jugular venous distention,   LYMPH NODES: no palpable adenopathy.   SKIN: good turgor, no rashes, no suspicious lesions.   HEART: S1, S2 normal, no murmurs, rubs, gallops, regular rate and rhythm.   LUNGS: normal effort / no distress, normal breath sounds,  clear to auscultation bilaterally, no wheezes, rales, rhonchi.   ABDOMEN: bowel sounds present, no hepatosplenomegaly, soft, nontender, nondistended.   MUSCULOSKELETAL: full range of motion, no swelling or deformity.   EXTREMITIES: no edema, no  clubbing, cyanosis, or edema.   NEUROLOGIC: nonfocal, cranial nerves 2-12 grossly intact, deep tendon reflexes 2+ symmetrical, normal strength, tone and reflexes, sensory exam intact.   PSYCH: cognitive function intact, mood/affect full range, speech clear.         Assessment:    1. Annual visit for general adult medical examination without abnormal findings    2. Screening for cervical cancer  - Pap IG, rfx Aptima HPV ASCU + Ct/Ng ThinPrep    3. Chronic maxillary sinusitis  - amoxicillin-clavulanate (Augmentin) 875-125 MG per tablet; Take 1 tablet by mouth 2 (two) times daily for 10 days  Dispense: 20 tablet; Refill: 0    4. Encounter for surveillance of contraceptive pills  - Tri-Sprintec 0.18/0.215/0.25 MG-35 MCG Tab; Take 1 tablet by mouth daily  Dispense: 90 tablet; Refill: 2    5. Mild depression    She's got a lot going on right now.  Just graduated, working as a Civil Service fast streamer for [employer], and investing in a food truck.  I do recommend counsleing help for her mild dpression. She's wanting to avoid medicines at this time, which is fine.    In addition to the physical, we also discussed additional concerns. Will proceed with evaluation and treatment, if needed follow up for another office visit to discussed further testing or treatment.          Plan:    Health Maintenance:   Discussed lifestyle recommendations to walk daily, 10,000 steps is a goal. Diet rich in fruits/vegetables, avoid snacks and processed foods. Discussed healthy sleep patterns.  Follow up as needed, or to further discuss and concerns addressed today.        Follow-up:    No follow-ups on file.         Myriam Jacobson, MD

## 2019-12-04 ENCOUNTER — Encounter (INDEPENDENT_AMBULATORY_CARE_PROVIDER_SITE_OTHER): Payer: Self-pay | Admitting: Family Medicine

## 2019-12-05 LAB — PAP IG, RFX APTIMA HPV ASCU + CT/NG TP
.: 0
Chlamydia, Nuc. Acid Amp: NEGATIVE
Gonococcus by Nucleic acid Amp: NEGATIVE

## 2019-12-05 NOTE — Progress Notes (Signed)
Your pap was normal.  Repeat in 5 years. (3 years for women under age 22.)

## 2019-12-07 IMAGING — DX RIGHT FOOT COMPLETE - 3+ VIEW
3 series · 3 of 3 positions shown · non-contrast
Comparison: None.

CLINICAL DATA: Food cart ran over right foot today; pain lateral
right foot

EXAM:
RIGHT FOOT COMPLETE - 3+ VIEW

[foot ap]
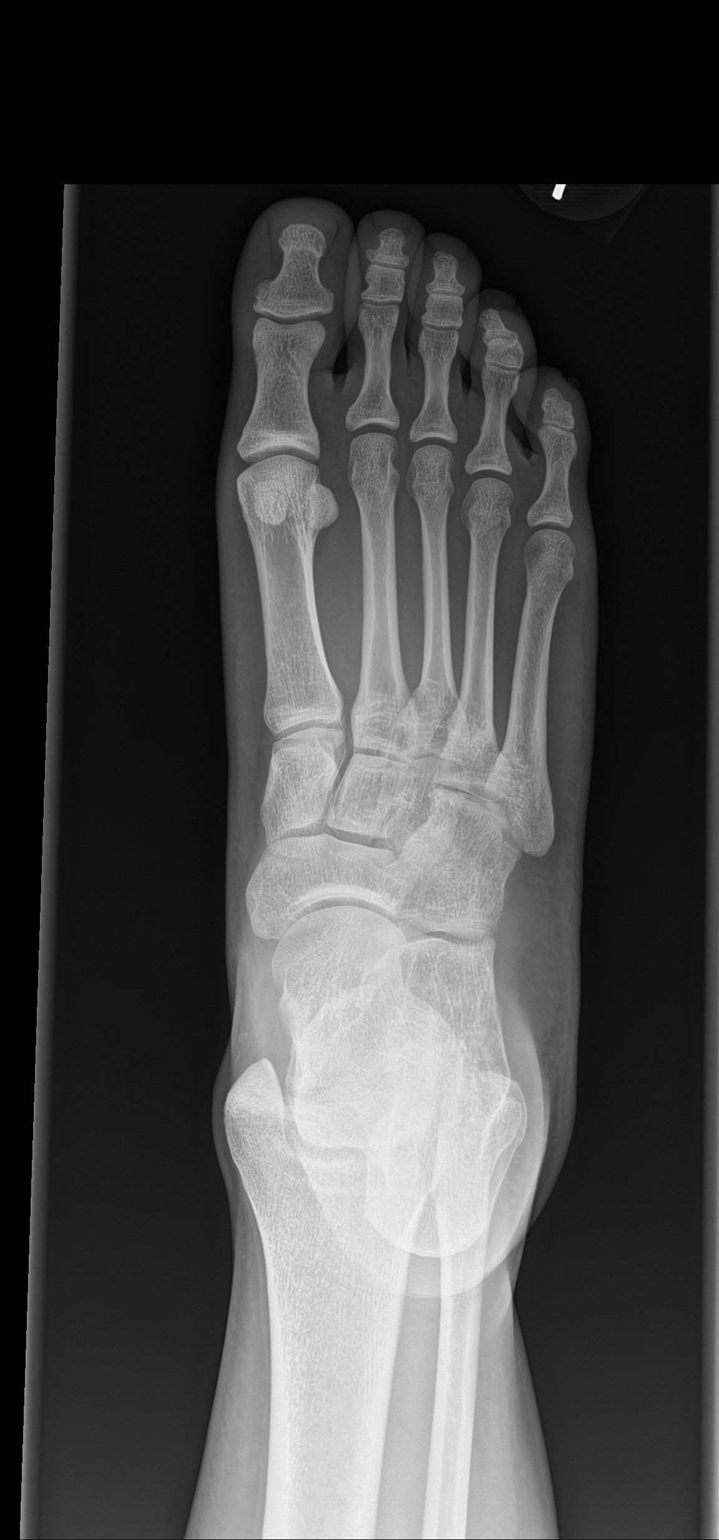

[foot obl]
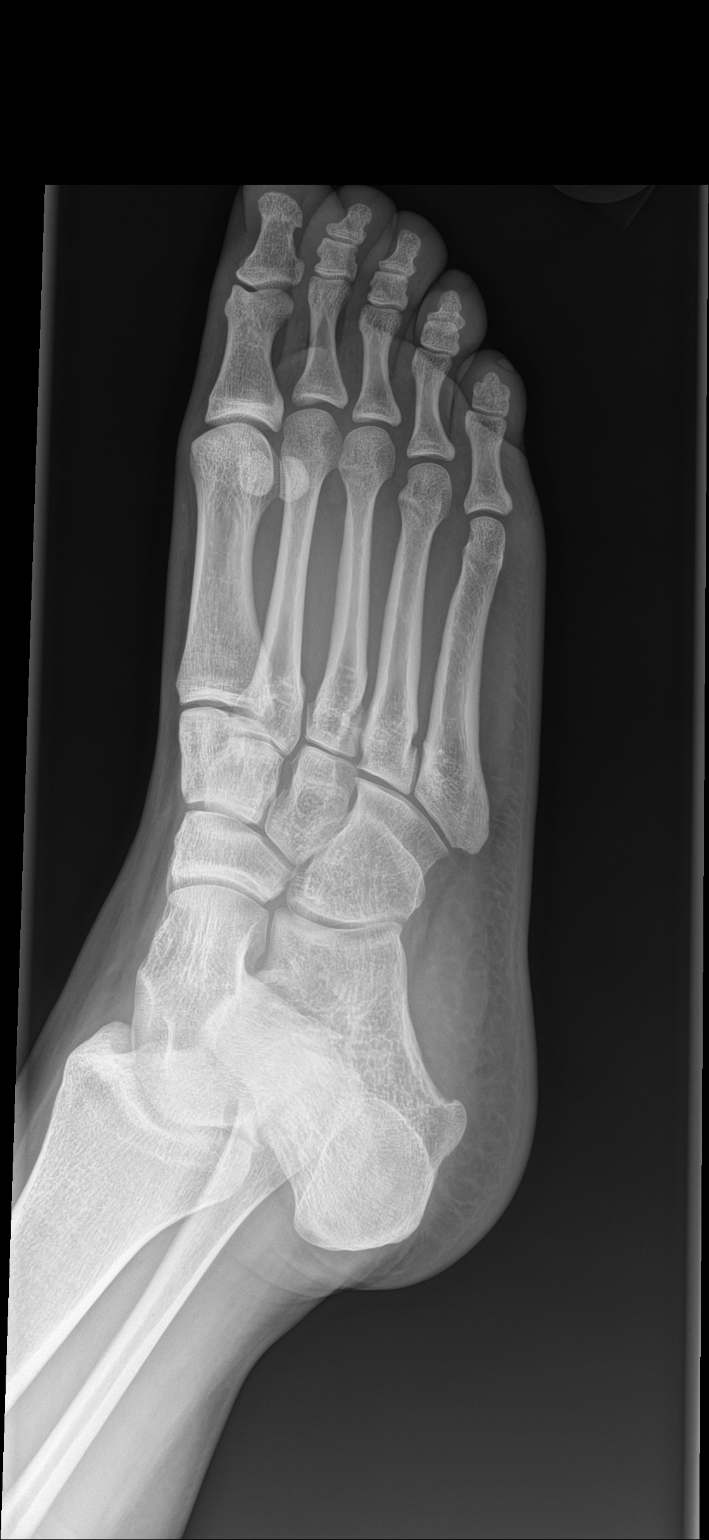

[foot lat]
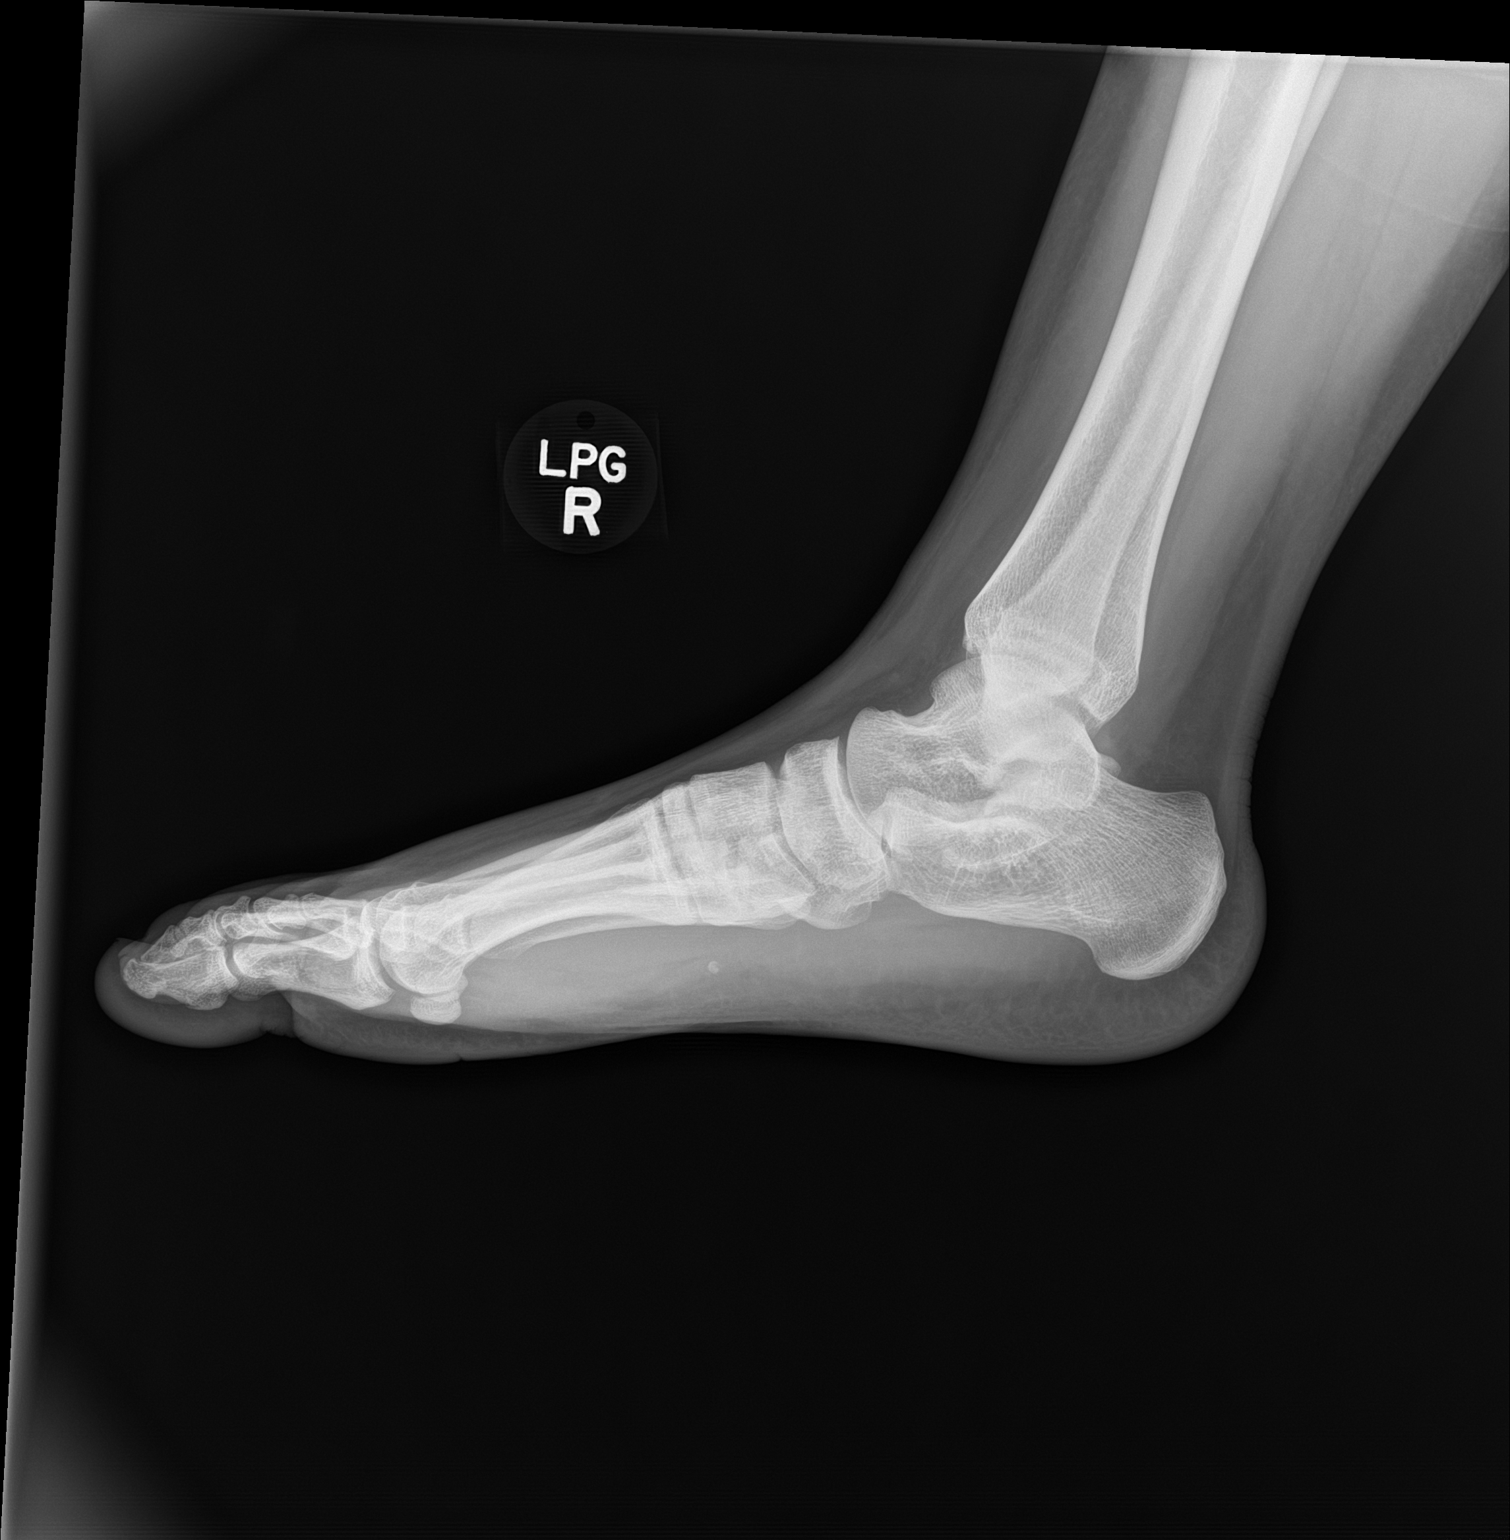

[3 of 3 positions shown; findings below may reference images not displayed]

FINDINGS: No fracture identified. The fifth metatarsal base and Lisfranc joint
alignment appear normal. No phalangeal cortical discontinuity is
observed. The lateral projection demonstrates a somewhat flattened
longitudinal arch of the foot although this could be projectional
and cysts image was not obtained with the patient standing.
IMPRESSION: 1. No fracture or acute bony finding is radiographically apparent.
No current malalignment at the Lisfranc joint.
2. Flattened longitudinal arch of the foot on the lateral view
although this may well be projectional given that this was not a
standing view, and accordingly does not necessarily imply pes
planus.

## 2020-07-13 ENCOUNTER — Encounter (INDEPENDENT_AMBULATORY_CARE_PROVIDER_SITE_OTHER): Payer: Self-pay | Admitting: Family Medicine

## 2020-07-13 ENCOUNTER — Telehealth (INDEPENDENT_AMBULATORY_CARE_PROVIDER_SITE_OTHER): Payer: No Typology Code available for payment source | Admitting: Family Medicine

## 2020-07-13 DIAGNOSIS — B373 Candidiasis of vulva and vagina: Secondary | ICD-10-CM

## 2020-07-13 DIAGNOSIS — B379 Candidiasis, unspecified: Secondary | ICD-10-CM

## 2020-07-13 MED ORDER — FLUCONAZOLE 150 MG PO TABS
150.00 mg | ORAL_TABLET | Freq: Once | ORAL | 0 refills | Status: AC
Start: 2020-07-13 — End: 2020-07-13

## 2020-07-13 NOTE — Progress Notes (Signed)
[facility] FAMILY PRACTICE - AN [place] PARTNER                       Date of Virtual Visit: 07/13/2020 9:05 PM        Patient ID: Phyllis Brown is a 25 y.o. female.  Attending Physician: Myriam Jacobson, MD       Telemedicine Eligibility:    State Location:  [x]  [place]  []  [place]  []  [place] []  [place]  []  Other:    Personal identity shared with patient:  [x]  Yes  []  No    Education on nature of video visit shared with patient, and patient agrees that this will be billed to the patient's insurance:  [x]  Yes  []  No         Chief Complaint:    Chief Complaint   Patient presents with    Vaginitis               HPI:    She was on an antibiotic for strep throat for 10 days.  Developed yeast infection symptoms 2 days ago.  Burning, itching, and discharge associated.  Tried OTC monistat with no relief.             Problem List:    There is no problem list on file for this patient.            Current Meds:    No outpatient medications have been marked as taking for the 07/13/20 encounter (Telemedicine Visit) with Myriam Jacobson, MD.          Allergies:    Allergies   Allergen Reactions    Fish-Derived Products Anaphylaxis    Motrin [Ibuprofen]     Nsaids Angioedema             Past Surgical History:    Past Surgical History:   Procedure Laterality Date    HERNIA REPAIR  07/11/2001    Rpr umbil hern reduc < 5 yr           Family History:    Family History   Problem Relation Age of Onset    No known problems Mother     No known problems Father     Leukemia Maternal Grandfather     Colon cancer Paternal Grandmother     Hypertension Paternal Grandfather            Social History:    Social History     Tobacco Use    Smoking status: Never Smoker    Smokeless tobacco: Never Used   Haematologist Use: Not on file   Substance Use Topics    Alcohol use: No    Drug use: No          The following sections were reviewed this encounter by the provider:   Tobacco    Allergies   Meds   Problems   Med Hx   Surg Hx   Fam Hx              Vital Signs:    There were no vitals taken for this visit.         ROS:    ROS  No High Fever  No Chest Pain  No Generalized Rash          Physical Exam:    Physical Exam   GENERAL APPEARANCE: alert, in no acute distress, pleasant, well nourished.  HEAD: normal appearance  EYES: no discharge  EARS: normal hearing  NECK/THYROID: appearance -supple  PSYCH: appropriate affect, appropriate mood, normal speech, normal attention        Assessment:    1. Candidiasis  - fluconazole (Diflucan) 150 MG tablet; Take 1 tablet (150 mg total) by mouth once for 1 dose 1 tab po x 1 then repeat if not resolved after 3 days  Dispense: 3 tablet; Refill: 0            Plan:    We discussed these concerns today, and pt was instructed to follow up if anything changes, new symptom development, or additional concerns.  Providers are available 24 hours a day for urgent issues, and in-person visits can be facilitated at anytime when appropriate.           Follow-up:    No follow-ups on file.         Myriam Jacobson, MD

## 2020-07-15 ENCOUNTER — Encounter (INDEPENDENT_AMBULATORY_CARE_PROVIDER_SITE_OTHER): Payer: Self-pay | Admitting: Family Medicine

## 2020-08-19 ENCOUNTER — Encounter (INDEPENDENT_AMBULATORY_CARE_PROVIDER_SITE_OTHER): Payer: Self-pay

## 2020-08-25 ENCOUNTER — Encounter (INDEPENDENT_AMBULATORY_CARE_PROVIDER_SITE_OTHER): Payer: Self-pay | Admitting: Family Medicine

## 2020-10-09 ENCOUNTER — Other Ambulatory Visit (INDEPENDENT_AMBULATORY_CARE_PROVIDER_SITE_OTHER): Payer: Self-pay | Admitting: Family Medicine

## 2020-10-09 DIAGNOSIS — Z3041 Encounter for surveillance of contraceptive pills: Secondary | ICD-10-CM

## 2020-10-09 MED ORDER — TRI-SPRINTEC 0.18/0.215/0.25 MG-35 MCG PO TABS
1.0000 | ORAL_TABLET | Freq: Every day | ORAL | 2 refills | Status: DC
Start: 2020-10-09 — End: 2021-02-25

## 2020-10-09 NOTE — Telephone Encounter (Signed)
Pharmacy faxed refill request.  Thanks!  KT

## 2021-02-09 ENCOUNTER — Other Ambulatory Visit (INDEPENDENT_AMBULATORY_CARE_PROVIDER_SITE_OTHER): Payer: Self-pay | Admitting: Family Medicine

## 2021-02-09 ENCOUNTER — Telehealth (INDEPENDENT_AMBULATORY_CARE_PROVIDER_SITE_OTHER): Payer: Self-pay | Admitting: Family Medicine

## 2021-02-09 DIAGNOSIS — R062 Wheezing: Secondary | ICD-10-CM

## 2021-02-09 MED ORDER — PROAIR HFA 108 (90 BASE) MCG/ACT IN AERS
2.00 | INHALATION_SPRAY | RESPIRATORY_TRACT | 3 refills | Status: DC | PRN
Start: 2021-02-09 — End: 2021-02-11

## 2021-02-09 MED ORDER — BUDESONIDE-FORMOTEROL FUMARATE 160-4.5 MCG/ACT IN AERO
2.00 | INHALATION_SPRAY | Freq: Two times a day (BID) | RESPIRATORY_TRACT | 3 refills | Status: AC
Start: 2021-02-09 — End: 2021-02-24

## 2021-02-09 NOTE — Telephone Encounter (Signed)
Patient is requesting refill of     budesonide-formoterol (Symbicort) 160-4.5 MCG/ACT inhaler (Order [id number])     albuterol (PROVENTIL) nebulizer solution 7.5 mg (Order [id number])    And would like it sent to:    [id number] - [place], [place] - [address]    Please advise.

## 2021-02-11 ENCOUNTER — Telehealth (INDEPENDENT_AMBULATORY_CARE_PROVIDER_SITE_OTHER): Payer: No Typology Code available for payment source | Admitting: Family

## 2021-02-11 ENCOUNTER — Encounter (INDEPENDENT_AMBULATORY_CARE_PROVIDER_SITE_OTHER): Payer: Self-pay | Admitting: Family

## 2021-02-11 VITALS — Temp 98.7°F | Wt 127.0 lb

## 2021-02-11 DIAGNOSIS — R7989 Other specified abnormal findings of blood chemistry: Secondary | ICD-10-CM | POA: Insufficient documentation

## 2021-02-11 DIAGNOSIS — J45909 Unspecified asthma, uncomplicated: Secondary | ICD-10-CM | POA: Insufficient documentation

## 2021-02-11 DIAGNOSIS — J4520 Mild intermittent asthma, uncomplicated: Secondary | ICD-10-CM

## 2021-02-11 MED ORDER — ALBUTEROL SULFATE HFA 108 (90 BASE) MCG/ACT IN AERS
2.00 | INHALATION_SPRAY | RESPIRATORY_TRACT | 3 refills | Status: AC | PRN
Start: 2021-02-11 — End: ?

## 2021-02-11 NOTE — Progress Notes (Signed)
[facility]                       Date of Virtual Visit: 02/11/2021 3:14 PM        Patient ID: Phyllis Brown is a 25 y.o. female.  Attending Physician: Gaetano Hawthorne, DNP FNP       Telemedicine Eligibility:    State Location:  [x]  [place]  []  [place]  []  [place] []  [place]  []  Other:    Physical Location:  [x]  Home  []  Office  []  Other:    Patient Consent to Billing: Yes         Chief Complaint:    Chief Complaint   Patient presents with    Asthma               HPI:    Pt needs refills on her albuterol inhaler today for her asthma/wheezing.    Doesn't use very often-- just recently had a flare up.   Does not want Rx to say DAW-- can be generic.   Monday night she was having a coughing fit. She needed her nebulizer but it was expired. She ended up in the Er Tuesday night. She was given prednisone which she felt that was too much for her. She is also on a 4 day taper dose. She feels that she is not tolerating this very well. She feels that her breathing is better. No sob, or wheezing.   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          Allergies:    Allergies   Allergen Reactions    Fish-Derived Products Anaphylaxis    Motrin [Ibuprofen]     Nsaids Angioedema             Past Surgical History:    Past Surgical History:   Procedure Laterality Date    HERNIA REPAIR  07/11/2001    Rpr umbil hern reduc < 5 yr           Family History:    Family History   Problem Relation Age of Onset    No known problems Mother     No known problems Father      Leukemia Maternal Grandfather     Colon cancer Paternal Grandmother     Hypertension Paternal Grandfather            Social History:    Social History     Tobacco Use    Smoking status: Never    Smokeless tobacco: Never   Substance Use Topics    Alcohol use: No    Drug use: No           The following sections were reviewed this encounter by the provider:   Tobacco  Allergies  Meds  Problems  Med Hx  Surg Hx  Fam Hx             Vital Signs:    Temp 98.7 F (37.1 C)   Wt 57.6 kg (127 lb)   BMI 22.50 kg/m          ROS:    Review of Systems   Constitutional:  Negative for fever.   Respiratory:  Negative for cough, chest tightness, shortness of breath and wheezing.    Cardiovascular:  Negative for chest pain.            Physical Exam:    Physical Exam   GENERAL APPEARANCE: well developed, well nourished, no acute distress  HEAD: normal appearance, atraumatic, no swelling of face  NECK/THYROID: full range of motion without pain  LUNGS/CHEST: unlabored, speaking in full sentences  NEURO: facial movement symmetric, normal speech, normal head movement, normal gait  PSYCH: alert and oriented, appropriate affect, appropriate mood, normal speech        Assessment/Plan:    1. Mild intermittent asthma without complication  - albuterol sulfate HFA (ProAir HFA) 108 (90 Base) MCG/ACT inhaler; Inhale 2 puffs into the lungs every 4 (four) hours as needed for Wheezing  Dispense: 1 each; Refill: 3  -Symptoms have mostly resolved  -Okay to stop steroids at this time given side effects  -Mostly weather induced now  -Is good on nebulizer and Singulair rx but needs proair refilled  -Continue to monitor symptoms            Follow-up:    Return if symptoms worsen or fail to improve.         Gaetano Hawthorne, DNP FNP

## 2021-02-24 ENCOUNTER — Other Ambulatory Visit (INDEPENDENT_AMBULATORY_CARE_PROVIDER_SITE_OTHER): Payer: Self-pay | Admitting: Family Medicine

## 2021-02-24 DIAGNOSIS — Z3041 Encounter for surveillance of contraceptive pills: Secondary | ICD-10-CM

## 2021-07-06 ENCOUNTER — Other Ambulatory Visit (INDEPENDENT_AMBULATORY_CARE_PROVIDER_SITE_OTHER): Payer: Self-pay | Admitting: Family Medicine

## 2021-07-06 ENCOUNTER — Telehealth (INDEPENDENT_AMBULATORY_CARE_PROVIDER_SITE_OTHER): Payer: Self-pay | Admitting: Family Medicine

## 2021-07-06 DIAGNOSIS — Z3041 Encounter for surveillance of contraceptive pills: Secondary | ICD-10-CM

## 2021-07-06 MED ORDER — NORGESTIM-ETH ESTRAD TRIPHASIC 0.18/0.215/0.25 MG-35 MCG PO TABS
1.0000 | ORAL_TABLET | Freq: Every day | ORAL | 0 refills | Status: AC
Start: 2021-07-06 — End: ?

## 2021-07-06 NOTE — Telephone Encounter (Signed)
Pt called stating she is out of he Rx Tri-Sprintec 0.18/0.215/0.25 MG-35 MCG Tab oral tablet   Needs temp refill till appt on 07/22/2021 is completely out   Please, Advise

## 2021-07-20 NOTE — Progress Notes (Deleted)
Date of Virtual Visit: 07/22/2021 11:30 AM        Patient ID: Phyllis Brown is a 26 y.o. female.  Attending Physician: Myriam Jacobson, MD       Telemedicine Eligibility:    State Location:  [x]  [place]  []  [place]  []  District of []    []  Other:    Personal identity shared with patient:  [x]  Yes  []  No    Education on nature of video visit shared with patient, and patient agrees that this will be billed to the patient's insurance:  [x]  Yes  []  No         Chief Complaint:    No chief complaint on file.              HPI:    Pt presents for med refill.            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          Vital Signs:    There were no vitals taken for this visit.         ROS:    ROS  No High Fever  No Chest Pain  No Generalized Rash          Physical Exam:    Physical Exam   GENERAL APPEARANCE: alert, in no acute distress, pleasant, well nourished.   HEAD: normal appearance  EYES: no discharge  EARS: normal hearing  NECK/THYROID: appearance -supple  PSYCH: appropriate affect, appropriate mood, normal speech, normal attention        Assessment:    There are no diagnoses linked to this encounter.          Plan:    We discussed these concerns today, and pt was instructed to follow up if anything changes, new symptom development, or additional concerns.  Providers are available 24 hours a day for urgent issues, and in-person visits can be facilitated at anytime when appropriate.           Follow-up:    No follow-ups on file.         [place], MD

## 2021-07-22 ENCOUNTER — Telehealth (INDEPENDENT_AMBULATORY_CARE_PROVIDER_SITE_OTHER): Payer: No Typology Code available for payment source | Admitting: Family Medicine

## 2022-01-05 DIAGNOSIS — F331 Major depressive disorder, recurrent, moderate: Secondary | ICD-10-CM | POA: Insufficient documentation

## 2022-01-05 DIAGNOSIS — F411 Generalized anxiety disorder: Secondary | ICD-10-CM | POA: Insufficient documentation

## 2022-01-05 DIAGNOSIS — J4520 Mild intermittent asthma, uncomplicated: Secondary | ICD-10-CM | POA: Insufficient documentation

## 2024-01-24 DIAGNOSIS — B9689 Other specified bacterial agents as the cause of diseases classified elsewhere: Secondary | ICD-10-CM | POA: Diagnosis not present

## 2024-01-24 DIAGNOSIS — R590 Localized enlarged lymph nodes: Secondary | ICD-10-CM | POA: Diagnosis not present

## 2024-01-24 DIAGNOSIS — N898 Other specified noninflammatory disorders of vagina: Secondary | ICD-10-CM | POA: Diagnosis not present

## 2024-01-24 DIAGNOSIS — Z113 Encounter for screening for infections with a predominantly sexual mode of transmission: Secondary | ICD-10-CM | POA: Diagnosis not present

## 2024-03-25 ENCOUNTER — Ambulatory Visit: Admitting: Internal Medicine

## 2024-04-17 ENCOUNTER — Ambulatory Visit (INDEPENDENT_AMBULATORY_CARE_PROVIDER_SITE_OTHER): Admitting: Internal Medicine

## 2024-04-17 ENCOUNTER — Encounter: Payer: Self-pay | Admitting: Internal Medicine

## 2024-04-17 VITALS — BP 100/84 | HR 64 | Temp 98.2°F | Ht 63.0 in | Wt 141.2 lb

## 2024-04-17 DIAGNOSIS — L680 Hirsutism: Secondary | ICD-10-CM | POA: Diagnosis not present

## 2024-04-17 DIAGNOSIS — F331 Major depressive disorder, recurrent, moderate: Secondary | ICD-10-CM | POA: Diagnosis not present

## 2024-04-17 DIAGNOSIS — Z30011 Encounter for initial prescription of contraceptive pills: Secondary | ICD-10-CM

## 2024-04-17 DIAGNOSIS — F411 Generalized anxiety disorder: Secondary | ICD-10-CM

## 2024-04-17 DIAGNOSIS — R41840 Attention and concentration deficit: Secondary | ICD-10-CM | POA: Diagnosis not present

## 2024-04-17 DIAGNOSIS — D509 Iron deficiency anemia, unspecified: Secondary | ICD-10-CM | POA: Insufficient documentation

## 2024-04-17 LAB — TESTOSTERONE: Testosterone: 44.31 ng/dL — ABNORMAL HIGH (ref 15.00–40.00)

## 2024-04-17 LAB — FOLLICLE STIMULATING HORMONE: FSH: 5.3 m[IU]/mL

## 2024-04-17 LAB — TSH: TSH: 1.78 u[IU]/mL (ref 0.35–5.50)

## 2024-04-17 LAB — POCT URINE PREGNANCY: Preg Test, Ur: NEGATIVE

## 2024-04-17 MED ORDER — NORETHINDRONE ACET-ETHINYL EST 1-20 MG-MCG PO TABS: 1.0000 | ORAL_TABLET | Freq: Every morning | ORAL | 3 refills | Status: AC

## 2024-04-17 NOTE — Progress Notes (Signed)
 [facility] [address] [place] [address] Dept: [phone] Dept Fax: [phone]  New Patient Office Visit  Subjective:   Natasha Krause 1996/06/22 04/17/2024  Chief Complaint  Patient presents with   New Patient (Initial Visit)    Patient has a few things to discuss with you.     HPI: Natasha Krause presents today to establish care at [facility]. Introduced to Publishing rights manager role and practice setting.  All questions answered.  Concerns: See below   Discussed the use of AI scribe software for clinical note transcription with the patient, who gave verbal consent to proceed.  History of Present Illness   Natasha Krause is a 28 year old female who presents to establish care and discuss hormone levels and ADHD symptoms.  She is concerned about increased facial hair growth on her neck, chin, and collarbone since discontinuing birth control in April 2025. Her menstrual cycles remain regular and non-painful, similar to when she was on birth control. She previously underwent laser hair removal when she was younger, which reduced some hair growth, but the issue persists. Her last menstrual period ended yesterday. She is sexually active.  She experiences symptoms consistent with ADHD, including feeling scattered, poor time management, and stress. She believes these symptoms may have contributed to her previous anxiety and depression diagnoses. She has not been formally diagnosed.  Her past medical history includes mild asthma, for which she does not regularly use an albuterol  inhaler, anxiety and depression for which she is not currently on medication, and iron deficiency anemia.   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 She has not had any blood transfusions and is interested in knowing her blood type for personal research  purposes.        04/17/2024   10:00 AM  Depression screen PHQ 2/9  Decreased Interest 0  Down, Depressed, Hopeless 2  PHQ - 2 Score 2  Altered sleeping 0  Tired, decreased energy 3  Change in appetite 1  Feeling bad or failure about yourself  1  Trouble concentrating 3  Moving slowly or fidgety/restless 3  Suicidal thoughts 0  PHQ-9 Score 13  Difficult doing work/chores Somewhat difficult      04/17/2024   10:01 AM  GAD 7 : Generalized Anxiety Score  Nervous, Anxious, on Edge 3  Control/stop worrying 3  Worry too much - different things 3  Trouble relaxing 3  Restless 3  Easily annoyed or irritable 3  Afraid - awful might happen 0  Total GAD 7 Score 18  Anxiety Difficulty Extremely difficult      The following portions of the patient's history were reviewed and updated as appropriate: past medical history, past surgical history, family history, social history, allergies, medications, and problem list.   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   Past Medical History:  Diagnosis Date   Asthma    IBS (irritable bowel syndrome)    Past Surgical History:  Procedure Laterality Date   HERNIA REPAIR     Family History  Problem Relation Age of Onset   Healthy Mother    Tremor Father    Ulcerative colitis Sister  Healthy Brother    Stroke Maternal Grandmother    Leukemia Maternal Grandfather    Colon cancer Paternal Grandmother    Dementia Paternal Grandfather    Joint hypermobility Paternal Grandfather    Pulmonary embolism Paternal Grandfather     Current Outpatient Medications:    norethindrone-ethinyl estradiol (LOESTRIN) 1-20 MG-MCG tablet, Take 1 tablet by mouth every morning., Disp: , Rfl:   Allergies  Allergen Reactions   Fish Allergy Anaphylaxis   Ibuprofen Hives   Nsaids     ROS: A complete ROS was performed with pertinent positives/negatives noted in the HPI. The remainder of the ROS are negative.   Objective:   Today's Vitals   04/17/24 1004  BP: 100/84  Pulse: 64  Temp: 98.2 F (36.8 C)  TempSrc: Oral  SpO2: 98%  Weight: 141 lb 3.2 oz (64 kg)  Height: 5' 3 (1.6 m)    GENERAL: Well-appearing, in NAD. Well nourished.  SKIN: Pink, warm and dry. No rash, lesion, ulceration, or ecchymoses. Hirsutism to chin NECK: Trachea midline. Full ROM w/o pain or tenderness. No lymphadenopathy.  RESPIRATORY: Chest wall symmetrical. Respirations even and non-labored. Breath sounds clear to auscultation bilaterally.  CARDIAC: S1, S2 present, regular rate and rhythm. Peripheral pulses 2+ bilaterally.  EXTREMITIES: Without clubbing, cyanosis, or edema.  NEUROLOGIC: No motor or sensory deficits. Steady, even gait.  PSYCH/MENTAL STATUS: Alert, oriented x 3. Cooperative, appropriate mood and affect.   Health Maintenance Due  Topic Date Due   HPV VACCINES (1 - 3-dose series) Never done   HIV Screening  Never done   Hepatitis C Screening  Never done   DTaP/Tdap/Td (1 - Tdap) Never done   Pneumococcal Vaccine (2 of 2 - PCV) 01/23/2018   COVID-19 Vaccine (3 - 2025-26 season) 03/11/2024    Results for orders placed or performed in visit on 04/17/24  POCT urine pregnancy  Result Value Ref Range   Preg Test, Ur Negative Negative    Assessment & Plan:  Assessment and Plan    Encounter for initial prescription of contraceptive pills Desires to restart birth control. Negative pregnancy test confirmed. Discussed benefits of birth control for hormone management and hair growth reduction. - Send prescription for Loestrin 1/20 to pharmacy.  Hirsutism Increased facial and body hair growth noted. No symptoms of PCOS. Discontinuation of birth control may have contributed. Plan to  evaluate hormone levels. Discussed birth control and other treatment options for hair management. - Order blood tests for testosterone, estradiol, FSH, and thyroid  levels. - Discuss potential for birth control to help manage hormone levels and reduce hair growth. - Discuss other treatment options such as electrolysis and laser hair removal if needed.  Suspected attention-deficit/hyperactivity disorder (ADHD) Symptoms consistent with ADHD reported. No formal diagnosis. Referral to psychiatry for evaluation. - Place referral to psychiatry for ADHD evaluation.  Depression and anxiety disorder Anxiety and depression present, currently managed without medication.       Orders Placed This Encounter  Procedures   Testosterone   TSH   Follicle stimulating hormone   Estradiol   DHEA-sulfate   Prolactin   Ambulatory referral to Behavioral Health    Referral Priority:   Routine    Referral Type:   Psychiatric    Referral Reason:   Specialty Services Required    Requested Specialty:   Behavioral Health    Number of Visits Requested:   1   POCT urine pregnancy   No orders of the defined types were placed in this encounter.  Return in about 4 months (around 08/18/2024) for Annual Physical Exam with fasting lab work.   Rosina Senters, FNP

## 2024-04-18 LAB — ESTRADIOL: Estradiol: 65 pg/mL

## 2024-04-18 LAB — PROLACTIN: Prolactin: 9.6 ng/mL

## 2024-04-18 LAB — DHEA-SULFATE: DHEA-SO4: 96 ug/dL (ref 14–349)

## 2024-04-24 ENCOUNTER — Ambulatory Visit: Payer: Self-pay | Admitting: Internal Medicine

## 2024-04-24 NOTE — Telephone Encounter (Signed)
 Patient has been notified directly; all questions, if any, were answered. Patient voiced understanding.  Lab result and would be picking up Birth control from pharmacy.

## 2024-06-03 DIAGNOSIS — Z113 Encounter for screening for infections with a predominantly sexual mode of transmission: Secondary | ICD-10-CM | POA: Diagnosis not present

## 2024-06-03 DIAGNOSIS — Z7251 High risk heterosexual behavior: Secondary | ICD-10-CM | POA: Diagnosis not present

## 2024-06-03 DIAGNOSIS — N898 Other specified noninflammatory disorders of vagina: Secondary | ICD-10-CM | POA: Diagnosis not present

## 2024-06-03 DIAGNOSIS — Z6824 Body mass index (BMI) 24.0-24.9, adult: Secondary | ICD-10-CM | POA: Diagnosis not present

## 2024-06-03 DIAGNOSIS — Z202 Contact with and (suspected) exposure to infections with a predominantly sexual mode of transmission: Secondary | ICD-10-CM | POA: Diagnosis not present

## 2024-08-20 ENCOUNTER — Encounter: Admitting: Internal Medicine
